# Patient Record
Sex: Female | Born: 1975 | Smoking: Never smoker
Health system: Southern US, Community
[De-identification: ages and names within clinical notes are randomized; demographics above are authoritative.]

## PROBLEM LIST (undated history)

## (undated) DIAGNOSIS — J984 Other disorders of lung: Secondary | ICD-10-CM

## (undated) DIAGNOSIS — J302 Other seasonal allergic rhinitis: Secondary | ICD-10-CM

## (undated) DIAGNOSIS — IMO0002 Reserved for concepts with insufficient information to code with codable children: Secondary | ICD-10-CM

## (undated) DIAGNOSIS — J80 Acute respiratory distress syndrome: Secondary | ICD-10-CM

## (undated) DIAGNOSIS — M329 Systemic lupus erythematosus, unspecified: Secondary | ICD-10-CM

## (undated) HISTORY — DX: Reserved for concepts with insufficient information to code with codable children: IMO0002

## (undated) HISTORY — DX: Other disorders of lung: J98.4

## (undated) HISTORY — DX: Other seasonal allergic rhinitis: J30.2

## (undated) HISTORY — DX: Systemic lupus erythematosus, unspecified: M32.9

## (undated) HISTORY — PX: TRACHEOSTOMY: SUR1362

## (undated) HISTORY — DX: Acute respiratory distress syndrome: J80

## (undated) HISTORY — PX: CHEST TUBE INSERTION: SHX231

## (undated) HISTORY — PX: OTHER SURGICAL HISTORY: SHX169

---

## 2019-12-20 ENCOUNTER — Other Ambulatory Visit: Payer: Self-pay

## 2019-12-20 DIAGNOSIS — D249 Benign neoplasm of unspecified breast: Secondary | ICD-10-CM

## 2019-12-21 ENCOUNTER — Other Ambulatory Visit: Payer: Self-pay

## 2019-12-21 DIAGNOSIS — D249 Benign neoplasm of unspecified breast: Secondary | ICD-10-CM

## 2020-01-21 ENCOUNTER — Encounter (INDEPENDENT_AMBULATORY_CARE_PROVIDER_SITE_OTHER): Payer: Self-pay

## 2020-01-22 ENCOUNTER — Encounter (INDEPENDENT_AMBULATORY_CARE_PROVIDER_SITE_OTHER): Payer: Self-pay | Admitting: Nurse Practitioner

## 2020-01-22 ENCOUNTER — Ambulatory Visit (INDEPENDENT_AMBULATORY_CARE_PROVIDER_SITE_OTHER): Payer: TRICARE Prime—HMO | Admitting: Nurse Practitioner

## 2020-01-22 VITALS — BP 126/78 | HR 81 | Temp 97.8°F | Resp 16 | Ht 64.75 in | Wt 140.4 lb

## 2020-01-22 DIAGNOSIS — Z23 Encounter for immunization: Secondary | ICD-10-CM | POA: Insufficient documentation

## 2020-01-22 DIAGNOSIS — J80 Acute respiratory distress syndrome: Secondary | ICD-10-CM

## 2020-01-22 DIAGNOSIS — M329 Systemic lupus erythematosus, unspecified: Secondary | ICD-10-CM | POA: Insufficient documentation

## 2020-01-22 DIAGNOSIS — N6001 Solitary cyst of right breast: Secondary | ICD-10-CM

## 2020-01-22 DIAGNOSIS — D229 Melanocytic nevi, unspecified: Secondary | ICD-10-CM

## 2020-01-22 DIAGNOSIS — Z131 Encounter for screening for diabetes mellitus: Secondary | ICD-10-CM | POA: Insufficient documentation

## 2020-01-22 DIAGNOSIS — Z1329 Encounter for screening for other suspected endocrine disorder: Secondary | ICD-10-CM

## 2020-01-22 DIAGNOSIS — Z13220 Encounter for screening for lipoid disorders: Secondary | ICD-10-CM | POA: Insufficient documentation

## 2020-01-22 DIAGNOSIS — Z0000 Encounter for general adult medical examination without abnormal findings: Secondary | ICD-10-CM | POA: Insufficient documentation

## 2020-01-22 DIAGNOSIS — N6002 Solitary cyst of left breast: Secondary | ICD-10-CM | POA: Insufficient documentation

## 2020-01-22 NOTE — Progress Notes (Signed)
Have you seen any specialists/other providers since your last visit with us?    Yes    Arm preference verified?   Yes    The patient is due for nothing at this time, HM is up-to-date.    Patient presented to the office for Tdap administration.  Received injection in the Left arm.  No reaction was noted and patient left in good condition.

## 2020-01-22 NOTE — Progress Notes (Signed)
Subjective:      Date: 01/22/2020 3:06 PM   Patient ID: Nicole Christensen is a 44 y.o. female.    Chief Complaint:  Chief Complaint   Patient presents with    Annual Exam     New Pt, Pt isn't fasting        New pt- moved from [place].   Hx ADS  Mom has DM 2 and HTN.   Last physical in August 2021, at [facility] [place] [facility].   Hx splenic arterio aneurysm surgery 9 years ago.   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 Sees pulmonologist every year, requesting referrals.  Hx Lupus, not on immunosuppression. Follow up with Rheumatologist every year, requesting referral.   She has multiple skin moles, would like to see derm      HPI  Visit Type: Health Maintenance Visit  Work Status: working full-time  Reported Health: good health  Reported Diet: moderate compliance with well-balanced diet  Reported Exercise: 3-4x/week  Dental: dentist visit > 1 year ago  Vision: LASIK and eye exam < 1 year ago  Hearing: normal hearing  Immunization Status: Tdap vaccination due  Reproductive Health: not currently sexually active and LNMP:  January 01, 2020  Prior Screening Tests: no prior PSA testing and colon cancer screening not approriate at this time  General Health Risks: no family history of prostate cancer, no family history of colon cancer and no family history of breast cancer  Safety Elements Used: uses seat belts, smoke detectors in household and no guns at home    Problem List:  There is no problem list on file for this patient.      Current Medications:  Outpatient Medications Marked as Taking for the 01/22/20 encounter (Office Visit) with Marga Melnick, FNP   Medication Sig Dispense Refill    ALBUTEROL IN Inhale into the lungs as needed      OLOPATADINE HCL OP Apply to eye 2 (two) times daily         Allergies:  Allergies   Allergen Reactions    Penicillins Other (See Comments)       Past Medical History:  Past Medical History:   Diagnosis Date    ARDS (adult respiratory distress  syndrome)     Lupus        Past Surgical History:  Past Surgical History:   Procedure Laterality Date    CHEST TUBE INSERTION      7 chest tube    spleenic arterioaneurysm      TRACHEOSTOMY         Family History:  Family History   Problem Relation Age of Onset    Hypertension Mother     Diabetes Mother     Hypertension Father     Hypertension Sister        Social History:  Social History     Tobacco Use    Smoking status: Never Smoker    Smokeless tobacco: Never Used   Haematologist Use: Never used   Substance Use Topics    Alcohol use: Yes     Alcohol/week: 1.0 standard drink     Types: 1 Glasses of wine per week    Drug use: Never         The following sections were reviewed this encounter by the provider:        ROS:   General/Constitutional:   Denies Change in appetite. Denies  Chills. Denies Fatigue. Denies Fever. Denies Weight gain. Denies Weight loss.   Ophthalmologic:   Denies Blurred vision. Denies Diminished visual acuity. Denies Eye Pain.   ENT:   Denies Hearing Loss. Denies Nasal Discharge. Denies Hoarseness. Denies Ear pain. Denies Nosebleed. Denies Sinus pain. Denies Sore throat. Denies Sneezing.   Endocrine:   Denies Polydipsia. Denies Polyuria.   Cardiovascular:   Denies Chest pain. Denies Chest pain with exertion. Denies Leg Claudication. Denies Palpitations. Denies Swelling in hands/feet.   Respiratory:   Denies Paroxysmal Nocturnal Dyspnea. Denies Cough. Denies Orthopnea. Denies Shortness of breath. Denies Daytime Hypersomnolence. Denies Snoring. Denies Witness Apnea. Denies Wheezing.   Gastrointestinal:   Denies Abdominal pain. Denies Blood in stool. Denies Constipation. Denies Diarrhea. Denies Heartburn. Denies Nausea. Denies Vomiting.   Hematology:   Denies Easy bruising. Denies Easy Bleeding.   Genitourinary:   Denies Blood in urine. Denies Nocturia.   Musculoskeletal:   Denies Joint pain. Denies Leg cramps. Denies Weakness in LE. Denies Swollen joints.   Peripheral  Vascular:   Denies Cold extremities. Denies Decreased sensation in extremities. Denies Painful extremities.   Skin:   Denies Itching. Denies Change in Mole(s). Denies Rash.   Neurologic:   Denies Balance difficulty. Denies Dizziness. Denies Headache. Denies Pre-Syncope. Denies Memory loss.   Psychiatric:   Denies Anxiety. Denies Depressed mood. Denies Difficulty sleeping. Denies Mood Swings.     Objective:     Vitals:  BP 126/78 (BP Site: Right arm, Patient Position: Sitting, Cuff Size: Medium)    Pulse 81    Temp 97.8 F (36.6 C) (Temporal)    Resp 16    Ht 1.645 m (5' 4.75")    Wt 63.7 kg (140 lb 6.4 oz)    LMP 01/01/2020    SpO2 100%    BMI 23.54 kg/m     Examination:   General Examination:   GENERAL APPEARANCE: alert, in no acute distress, well developed, well nourished, oriented to time, place, and person.   HEAD: normal appearance, atraumatic.   EYES: extraocular movement intact (EOMI), pupils equal, round, reactive to light and accommodation, sclera anicteric, conjunctiva clear.   EARS: tympanic membranes normal bilaterally, external canals normal .   NOSE: normal nasal mucosa, no lesions.   ORAL CAVITY: normal oropharynx, normal lips, mucosa moist, no lesions.   THROAT: normal appearance, clear, no erythema.   NECK/THYROID: neck supple, no carotid bruit, carotid pulse 2+ bilaterally, no cervical lymphadenopathy, no neck mass palpated, no jugular venous distention, no thyromegaly.   LYMPH NODES: no palpable adenopathy.   SKIN: Scattered skin moles throughout her body. Good turgor, no rashes, no suspicious lesions.   HEART: S1, S2 normal, no murmurs, rubs, gallops, regular rate and rhythm.   LUNGS: normal effort / no distress, normal breath sounds, clear to auscultation bilaterally, no wheezes, rales, rhonchi.   ABDOMEN: bowel sounds present, no hepatosplenomegaly, soft, nontender, nondistended.   Breasts: Scattered breast tenderness throughout both breasts.no skin or nipple changes or axillary  nodes.  Pelvic exam: will do pap and pelvic exam in next visit.  MUSCULOSKELETAL: full range of motion, no swelling or deformity.   EXTREMITIES: no edema, no clubbing, cyanosis, or edema.   PERIPHERAL PULSES: 2+ dorsalis pedis, 2+ posterior tibial.   NEUROLOGIC: nonfocal, cranial nerves 2-12 grossly intact, deep tendon reflexes 2+ symmetrical, normal strength, tone and reflexes, sensory exam intact.   PSYCH: cognitive function intact, mood/affect full range, speech clear.     Assessment/Plan:  1. Encounter for annual physical exam    - CBC without differential; Future  - Comprehensive metabolic panel; Future  - Hemoglobin A1C; Future  - Lipid panel; Future  - TSH; Future  - T4, free; Future  - Urinalysis Reflex to Microscopic Exam- Reflex to Culture; Future  - Due for pap and mammogram. Pt states she got order for mammogram from [place] [facility], made an app next month, Hx breast cysts- had breast biopsy 8 months ago- was negative.    2. Screening for diabetes mellitus (DM)    - Hemoglobin A1C; Future    3. Screening for hyperlipidemia    - Lipid panel; Future    4. Thyroid disorder screening    - TSH; Future  - T4, free; Future    5. 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 Sees pulmonologist every year,  - Pulmonary Disease Referral: Lang Snow, MD ([facility])    6. SLE (systemic lupus erythematosus related syndrome)    - Rheumatology Referral: Delmer Islam, MD [facility])    7. Multiple atypical skin moles    - Referral to Dermatology - EXTERNAL    8. Bilateral breast cysts    -Due for pap and mammogram. Pt states she got order for mammogram from [place] [facility], made an app next month, Hx breast cysts- had breast biopsy 8 months ago- was negative      9. Immunization due    - Tdap vaccine greater than or equal to 7yo IM    Body mass index is 23.54 kg/m.        Risk & Benefits of the new  medication(s) were explained to the patient who verbalized understanding & agreed to the treatment plan    Health Maintenance:  Recommend optimizing low carbohydrate diet efforts and obtaining at least 150 minutes of aerobic exercise per week. Recommend 20-25 grams of dietary fiber daily. Recommend drinking at least 60-80 ounces of water per day. Recommend optimizing low sodium diet measures ( less than 2 grams of sodium in the diet per day ). Obtain a nutrition consult to help individualize dietary recommendations to help achieve glycemic and/or lipid goals Obtain a naturopathic consult to help individualize dietary and supplement  recommendations to help achieve glycemic and/or lipid goals Recommend adding a daily probiotic. Dental Screening is due. Mammogram screening is due.  Screening mammogram ordered. Gynecology surveillance is due.  Recommend follow-up with gynecology at earliest convenience.    No follow-ups on file.    Marga Melnick, FNP  [employer]

## 2020-01-23 ENCOUNTER — Other Ambulatory Visit (FREE_STANDING_LABORATORY_FACILITY): Payer: TRICARE Prime—HMO

## 2020-01-23 ENCOUNTER — Other Ambulatory Visit (INDEPENDENT_AMBULATORY_CARE_PROVIDER_SITE_OTHER): Payer: Self-pay | Admitting: Nurse Practitioner

## 2020-01-23 ENCOUNTER — Encounter (INDEPENDENT_AMBULATORY_CARE_PROVIDER_SITE_OTHER): Payer: Self-pay | Admitting: Nurse Practitioner

## 2020-01-23 DIAGNOSIS — N946 Dysmenorrhea, unspecified: Secondary | ICD-10-CM

## 2020-01-23 DIAGNOSIS — Z1329 Encounter for screening for other suspected endocrine disorder: Secondary | ICD-10-CM

## 2020-01-23 DIAGNOSIS — Z131 Encounter for screening for diabetes mellitus: Secondary | ICD-10-CM

## 2020-01-23 DIAGNOSIS — Z13220 Encounter for screening for lipoid disorders: Secondary | ICD-10-CM

## 2020-01-23 DIAGNOSIS — Z0000 Encounter for general adult medical examination without abnormal findings: Secondary | ICD-10-CM

## 2020-01-23 LAB — URINALYSIS REFLEX TO MICROSCOPIC EXAM - REFLEX TO CULTURE
Bilirubin, UA: NEGATIVE
Blood, UA: NEGATIVE
Glucose, UA: NEGATIVE
Ketones UA: NEGATIVE
Leukocyte Esterase, UA: NEGATIVE
Nitrite, UA: NEGATIVE
Protein, UR: NEGATIVE
Specific Gravity UA: 1.02 (ref 1.001–1.035)
Urine Bacteria: NONE SEEN /hpf
Urine pH: 6.5 (ref 5.0–8.0)
Urobilinogen, UA: 0.2 (ref 0.2–2.0)

## 2020-01-23 LAB — COMPREHENSIVE METABOLIC PANEL
ALT: 14 U/L (ref 0–55)
AST (SGOT): 13 U/L (ref 5–34)
Albumin/Globulin Ratio: 1.3 (ref 0.9–2.2)
Albumin: 3.8 g/dL (ref 3.5–5.0)
Alkaline Phosphatase: 57 U/L (ref 37–117)
Anion Gap: 8 (ref 5.0–15.0)
BUN: 16 mg/dL (ref 7.0–19.0)
Bilirubin, Total: 0.6 mg/dL (ref 0.2–1.2)
CO2: 23 mEq/L (ref 21–29)
Calcium: 8.8 mg/dL (ref 8.5–10.5)
Chloride: 104 mEq/L (ref 100–111)
Creatinine: 0.9 mg/dL (ref 0.4–1.5)
Globulin: 3 g/dL (ref 2.0–3.7)
Glucose: 76 mg/dL (ref 70–100)
Potassium: 4.2 mEq/L (ref 3.5–5.1)
Protein, Total: 6.8 g/dL (ref 6.0–8.3)
Sodium: 135 mEq/L — ABNORMAL LOW (ref 136–145)

## 2020-01-23 LAB — LIPID PANEL
Cholesterol / HDL Ratio: 2.9
Cholesterol: 166 mg/dL (ref 0–199)
HDL: 58 mg/dL (ref 40–9999)
LDL Calculated: 94 mg/dL (ref 0–99)
Triglycerides: 68 mg/dL (ref 34–149)
VLDL Calculated: 14 mg/dL (ref 10–40)

## 2020-01-23 LAB — CBC
Absolute NRBC: 0 10*3/uL (ref 0.00–0.00)
Hematocrit: 42.7 % (ref 34.7–43.7)
Hgb: 14.2 g/dL (ref 11.4–14.8)
MCH: 29.3 pg (ref 25.1–33.5)
MCHC: 33.3 g/dL (ref 31.5–35.8)
MCV: 88 fL (ref 78.0–96.0)
MPV: 10.4 fL (ref 8.9–12.5)
Nucleated RBC: 0 /100 WBC (ref 0.0–0.0)
Platelets: 247 10*3/uL (ref 142–346)
RBC: 4.85 10*6/uL (ref 3.90–5.10)
RDW: 14 % (ref 11–15)
WBC: 7.52 10*3/uL (ref 3.10–9.50)

## 2020-01-23 LAB — HEMOGLOBIN A1C
Average Estimated Glucose: 93.9 mg/dL
Hemoglobin A1C: 4.9 % (ref 4.6–5.9)

## 2020-01-23 LAB — TSH: TSH: 1.78 u[IU]/mL (ref 0.35–4.94)

## 2020-01-23 LAB — T4, FREE: T4 Free: 0.85 ng/dL (ref 0.70–1.48)

## 2020-01-23 LAB — HEMOLYSIS INDEX: Hemolysis Index: 5 (ref 0–24)

## 2020-01-23 LAB — GFR: EGFR: >60

## 2020-01-23 MED ORDER — NAPROXEN 500 MG PO TABS
500.0000 mg | ORAL_TABLET | Freq: Two times a day (BID) | ORAL | 0 refills | Status: AC
Start: 2020-01-23 — End: 2020-02-07

## 2020-02-05 ENCOUNTER — Other Ambulatory Visit: Payer: Self-pay

## 2020-02-05 ENCOUNTER — Ambulatory Visit
Admission: RE | Admit: 2020-02-05 | Discharge: 2020-02-05 | Disposition: A | Discharge status: Home / Self Care | Payer: Non-veteran care | Source: Ambulatory Visit | Attending: Family Medicine | Admitting: Family Medicine

## 2020-02-05 ENCOUNTER — Ambulatory Visit
Admission: RE | Admit: 2020-02-05 | Discharge: 2020-02-05 | Disposition: A | Discharge status: Home / Self Care | Payer: Non-veteran care | Source: Ambulatory Visit

## 2020-02-05 DIAGNOSIS — D249 Benign neoplasm of unspecified breast: Secondary | ICD-10-CM

## 2020-02-05 DIAGNOSIS — N6001 Solitary cyst of right breast: Secondary | ICD-10-CM | POA: Insufficient documentation

## 2020-02-13 ENCOUNTER — Other Ambulatory Visit: Payer: Self-pay

## 2020-02-13 ENCOUNTER — Ambulatory Visit
Admission: RE | Admit: 2020-02-13 | Discharge: 2020-02-13 | Disposition: A | Discharge status: Home / Self Care | Payer: Self-pay | Source: Ambulatory Visit

## 2020-02-13 DIAGNOSIS — Z1239 Encounter for other screening for malignant neoplasm of breast: Secondary | ICD-10-CM

## 2020-02-15 ENCOUNTER — Telehealth (INDEPENDENT_AMBULATORY_CARE_PROVIDER_SITE_OTHER): Payer: Self-pay | Admitting: Nurse Practitioner

## 2020-02-15 ENCOUNTER — Ambulatory Visit (INDEPENDENT_AMBULATORY_CARE_PROVIDER_SITE_OTHER): Payer: TRICARE Prime—HMO | Admitting: Nurse Practitioner

## 2020-02-15 NOTE — Telephone Encounter (Signed)
Pt requests to have the referrals from 01/22/20 sent to [employer] for approval.

## 2020-02-15 NOTE — Telephone Encounter (Signed)
Spoke w/ pt all referrals will be process, she will get copies and specialty providers approval will be faxed and scanned.  Waiting for her to let me know about dermatology

## 2020-02-19 ENCOUNTER — Encounter (INDEPENDENT_AMBULATORY_CARE_PROVIDER_SITE_OTHER): Payer: Self-pay

## 2020-02-27 ENCOUNTER — Encounter (INDEPENDENT_AMBULATORY_CARE_PROVIDER_SITE_OTHER): Payer: Self-pay

## 2020-02-27 ENCOUNTER — Telehealth (INDEPENDENT_AMBULATORY_CARE_PROVIDER_SITE_OTHER): Payer: Self-pay | Admitting: Nurse Practitioner

## 2020-02-27 NOTE — Telephone Encounter (Signed)
Today, I called the pt, she said, she had changed the PCM . I asked her to do it again. She will call [employer] in order to get this issue resolved.   Gave all the information needed. Voiced understanding.

## 2020-02-27 NOTE — Telephone Encounter (Signed)
Documentation -    Call placed on 02/26/20 2:30pm  I spoke with [employer], about the pending approval for Referral Dr. Sharen Hint. [facility].  auth # [id number]. They said pt needs to call them and change her PCM. As of the day of the call she had [facility], pt needs to call them and changed the pcm(pcp) in order to change this referral. The intersting thing is they approved the other referral for pulpnology.

## 2020-03-04 ENCOUNTER — Other Ambulatory Visit: Payer: Self-pay

## 2020-03-04 ENCOUNTER — Ambulatory Visit
Admission: RE | Admit: 2020-03-04 | Discharge: 2020-03-04 | Disposition: A | Discharge status: Home / Self Care | Payer: Self-pay | Source: Ambulatory Visit

## 2020-03-07 ENCOUNTER — Ambulatory Visit (INDEPENDENT_AMBULATORY_CARE_PROVIDER_SITE_OTHER): Payer: TRICARE Prime—HMO | Admitting: Nurse Practitioner

## 2020-03-07 ENCOUNTER — Other Ambulatory Visit: Payer: Self-pay | Admitting: Family Medicine

## 2020-03-07 ENCOUNTER — Encounter (INDEPENDENT_AMBULATORY_CARE_PROVIDER_SITE_OTHER): Payer: Self-pay | Admitting: Nurse Practitioner

## 2020-03-07 VITALS — BP 122/80 | HR 74 | Temp 98.0°F | Resp 20 | Ht 65.5 in | Wt 140.4 lb

## 2020-03-07 DIAGNOSIS — Z01419 Encounter for gynecological examination (general) (routine) without abnormal findings: Secondary | ICD-10-CM | POA: Insufficient documentation

## 2020-03-07 DIAGNOSIS — Z309 Encounter for contraceptive management, unspecified: Secondary | ICD-10-CM

## 2020-03-07 MED ORDER — NORETHINDRONE ACET-ETHINYL EST 1.5-30 MG-MCG PO TABS
1.0000 | ORAL_TABLET | Freq: Every day | ORAL | 2 refills | Status: AC
Start: 2020-03-07 — End: 2020-05-30

## 2020-03-07 NOTE — Patient Instructions (Signed)
Junel 1.5/30 21 Day Pack   Uses  This medicine is used for the following purposes:  · acne  · birth control  · depression  · endocrine disorder  · menstrual problem  Instructions  This medicine may be taken with or without food.  It is very important that you take the medicine at about the same time every day. It will work best if you do this.  Store at room temperature in a dry place. Do not keep in the bathroom.  Keep the medicine away from heat and light.  If this is the first time that you are using this medicine, please speak with your doctor about when you should start this medicine.  If you are switching from a different birth control pill, start this medicine on the same day you would start your other birth control pack.  If you are using a birth control pack with 28 pills, start the first pill of a new pack on the day after the last pill of the previous pack.  If you are using a birth control pack that has 21 pills, wait 7 days after taking the last pill of the empty pack before you take the first pill of a new pack.  If you forget to take a dose, take it as soon as you remember. If it is time for your next dose, you are allowed to take 2 doses at once. Return to your normal dosing schedule on the following day. If you miss more than one dose, ask your doctor what you should do to get back on schedule.  Speak with your doctor about using additional forms of birth control if you forget one or more doses of your medicine.  Some patients may have mild vaginal bleeding or spotting while on this medicine. If this happens, do not stop taking this medicine. If the bleeding or spotting lasts more than a few days or becomes heavier, contact your doctor.  This medicine may cause dark patches to appear on your face. Avoid sunlight and use sunscreen lotion to minimize further darkening of these skin patches.  Please tell your doctor and pharmacist about all the medicines you take. Include both prescription and  over-the-counter medicines. Also tell them about any vitamins, herbal medicines, or anything else you take for your health.  This medicine may affect your blood sugar levels. If you have diabetes, talk to your doctor before changing the dose of your diabetes medicine.  Vomiting or diarrhea can prevent birth control pills from working well. If you have vomiting or diarrhea, you may need to use an extra form of birth control (such as condoms). Check with your doctor or pharmacist if you have questions.  This medicine may interfere with some lab test results. Be sure to tell all your healthcare providers that you are taking this medicine.  It is very important that you keep all appointments for medical exams and tests while on this medicine.  Cautions  Tell your doctor and pharmacist if you ever had an allergic reaction to a medicine. Symptoms of an allergic reaction can include trouble breathing, skin rash, itching, swelling, or severe dizziness.  This medicine is associated with an increased risk of serious heart problems, heart attack, and stroke. Please speak with your doctor about the risks and benefits of using this medicine. Contact your doctor immediately if you experience chest pain or difficulty breathing.  Do not use the medication any more than instructed.  Avoid smoking while on   this medicine. Smoking may increase your risk for stroke, heart attack, blood clots, high blood pressure, and other diseases of the heart and blood vessels.  If you miss your period while on this medicine, contact your doctor.  Ask your doctor to show you how to perform a self breast examination. You should check your breasts once a month and report any changes to your doctor.  Talk to your doctor about getting a complete physical exam every year while on this medicine.  Use condoms to reduce the risk of sexually transmitted diseases.  Tell the doctor or pharmacist if you are pregnant, planning to be pregnant, or breastfeeding.  Do  not use this medicine if you are pregnant. If you become pregnant while on this medicine, contact your doctor immediately.  Do not take St. John's wort while on this medicine.  Ask your pharmacist if this medicine can interact with any of your other medicines. Be sure to tell them about all the medicines you take.  Please tell all your doctors and dentists that you are on this medicine before they provide care.  Do not start or stop any other medicines without first speaking to your doctor or pharmacist.  Do not share this medicine with anyone who has not been prescribed this medicine.  Side Effects  The following is a list of some common side effects from this medicine. Please speak with your doctor about what you should do if you experience these or other side effects.  · bloating  · breast pain or swelling  · swelling of the legs, feet, and hands  · headaches  · high blood pressure  · menstruation changes (missed or fewer periods)  · nausea  · stomach upset or abdominal pain  · vaginal bleeding or spotting between periods  · vomiting  · weight gain  Call your doctor or get medical help right away if you notice any of these more serious side effects:  · severe or persistent abdominal pain  · breast lumps  · chest pain  · changes in memory, mood, or thinking  · depression or feeling sad  · fainting  · severe or persistent headache  · jaw pain  · sudden leg pain, swelling, warmth or redness  · symptoms of liver damage (such as yellowing of skin or eyes, dark urine, unusual tiredness or weakness; severe stomach or back pain)  · shortness of breath  · symptoms of stroke (such as one-sided weakness, slurred speech, confusion)  · blurring or changes of vision  A few people may have an allergic reactions to this medicine. Symptoms can include difficulty breathing, skin rash, itching, swelling, or severe dizziness. If you notice any of these symptoms, seek medical help quickly.  Extra  Please speak with your doctor,  nurse, or pharmacist if you have any questions about this medicine.  [email or web address]  IMPORTANT NOTE: This document tells you briefly how to take your medicine, but it does not tell you all there is to know about it.Your doctor or pharmacist may give you other documents about your medicine. Please talk to them if you have any questions.Always follow their advice. There is a more complete description of this medicine available in English.Scan this code on your smartphone or tablet or use the web address below. You can also ask your pharmacist for a printout. If you have any questions, please ask your pharmacist.   © 2021 [employer]

## 2020-03-07 NOTE — Progress Notes (Signed)
[facility] PRIMARY CARE  [place]  PROGRESS NOTE      Patient: Nicole Christensen   Date: 03/07/2020   MRN: [id number]     ASSESSMENT/PLAN     Heaven Meeker is a 44 y.o. female    Chief Complaint   Patient presents with    Gynecologic Exam    Contraception          1. Encounter for annual routine gynecological examination    - Pap Smear, Thin Prep w/ reflex to HR HPV  - She recently had mammogram, showed cystic breast. Scheduled for breast cyst aspiration at [facility] radiology.     2. Encounter for contraceptive management, unspecified type    - She would like to start birth control to regulate her period. She is not sexual active. Started her on Junel. Discussed the OCPs use and side effects, may notice nausea, headache, dizziness for next 3-4 weeks. She may need to use other means of contraception for 1 month.   - She recently had mammogram, showed cystic breast. Scheduled for breast cyst aspiration at [facility] radiology.   - Norethindrone Acet-Ethinyl Est (Junel 1.5/30) 1.5-30 MG-MCG per tablet; Take 1 tablet by mouth daily  Dispense: 30 tablet; Refill: 2    Body mass index is 23.01 kg/m.        Risk & Benefits of the new medication(s) were explained to the patient who verbalized understanding & agreed to the treatment plan         MEDICATIONS     Current Outpatient Medications   Medication Sig Dispense Refill    ALBUTEROL IN Inhale into the lungs as needed       No current facility-administered medications for this visit.       Allergies   Allergen Reactions    Penicillins Other (See Comments)       SUBJECTIVE     Chief Complaint   Patient presents with    Gynecologic Exam    Contraception        OB/GYN:  [id number]  *LMP November 24th.  *Menarche:44 YO  *Cycles:  Irregular with 3-4 days difference, blood flow is normal. She would like to start birth control now. She denies hx DVT and no smoking.  *Sexually active:No:   *Last Pap smear:5 years ago  *Last mammogram:5 years ago      Last pap 1 year ago in Sept.    Never had abnormal pap. Pt would like to do it today.         Gynecologic Exam  The patient's pertinent negatives include no genital itching, genital lesions, genital odor, genital rash, missed menses, pelvic pain, vaginal bleeding or vaginal discharge. The problem has been unchanged. The patient is experiencing no pain. Pertinent negatives include no abdominal pain, anorexia, back pain, chills, constipation, diarrhea, discolored urine, dysuria, fever, flank pain, frequency, headaches, hematuria, joint pain, joint swelling, nausea, painful intercourse, rash, sore throat, urgency or vomiting. Nothing aggravates the symptoms. She is not sexually active. She uses nothing for contraception. Her menstrual history has been irregular. There is no history of an abdominal surgery, a Cesarean section, an ectopic pregnancy, endometriosis, a gynecological surgery, herpes simplex, menorrhagia, metrorrhagia, miscarriage, ovarian cysts, perineal abscess, PID, an STD, a terminated pregnancy or vaginosis.           ROS     Review of Systems   Constitutional: Negative for activity change, appetite change, chills, diaphoresis, fatigue, fever and unexpected weight change.   HENT: Negative for sore throat.  Respiratory: Negative for apnea, cough, choking, chest tightness, shortness of breath, wheezing and stridor.    Gastrointestinal: Negative for abdominal pain, anorexia, constipation, diarrhea, nausea and vomiting.   Genitourinary: Negative for dysuria, flank pain, frequency, hematuria, menorrhagia, missed menses, pelvic pain, urgency and vaginal discharge.   Musculoskeletal: Negative for back pain and joint pain.   Skin: Negative for rash.   Neurological: Negative for headaches.       Patient Active Problem List   Diagnosis    Encounter for annual physical exam    Screening for diabetes mellitus (DM)    Screening for hyperlipidemia    Thyroid disorder screening    ARDS (adult respiratory distress syndrome)    SLE (systemic  lupus erythematosus related syndrome)    Multiple atypical skin moles    Bilateral breast cysts    Immunization due    Menstrual cramps       Past Medical History:   Diagnosis Date    ARDS (adult respiratory distress syndrome)     Lupus            The following portions of the patient's history were reviewed and updated as appropriate: Allergies, Current Medications, Past Family History, Past Medical history, Past social history, Past surgical history, and Problem List.      PHYSICAL EXAM     Vitals:    03/07/20 0856 03/07/20 0857   BP: 136/80 122/80   Pulse: 74    Resp: 20    Temp: 98 F (36.7 C)    SpO2: 98%    Weight: 63.7 kg (140 lb 6.4 oz)    Height: 1.664 m (5' 5.5")          Physical Exam    Pt alert, oriented x 3, no distress, comfortable  Skin: skin color, texture, turgor normal, no rashes or lesions.  Eyes: Conjunctivae and corneas clear.  Neck: No adenopathy, no carotid bruit, no JVD, supple, symmetrical, trachea midline and Thyroid not enlarged, no tenderness or masses visible  Breast: Inspection: Deferred  Palpation: Deferred  Lungs:Breaths sounds present bilaterally, clear to auscultation, no wheezing, no rales, no rhonchi   Heart: Regular rate and rhythm, S1-S2 present, no murmurs, no rubs, no gallops  Abdomen: Soft, non tender, no masses palpable, bowel sounds present and normal, no rebound tenderness, no guarding, no distension, no hernias visible.  Genitourinary:   Pelvic exam:  VULVA: normal appearing vulva with no masses, tenderness or lesions, normal clitoris  VAGINA: normal appearing vagina with normal color and discharge, no lesions,  CERVIX: normal appearing cervix without discharge or lesions,  UTERUS: uterus is normal size, shape, consistency and nontender  ADNEXA: nontender and no masses.  Psychiatric:Normal mood and affect. Behavior is normal. Judgment and thought content normal.        Signed,  Marga Melnick, FNP NP  03/07/2020

## 2020-03-07 NOTE — Progress Notes (Signed)
Have you seen any specialists/other providers since your last visit with us?    No    Arm preference verified?   Yes    The patient is due for pap smear

## 2020-03-26 LAB — PAP SMEAR, THIN PREP W/ REFLEX TO HR HPV

## 2020-04-11 ENCOUNTER — Ambulatory Visit (INDEPENDENT_AMBULATORY_CARE_PROVIDER_SITE_OTHER): Payer: TRICARE Prime—HMO | Admitting: Internal Medicine

## 2020-05-07 ENCOUNTER — Encounter (INDEPENDENT_AMBULATORY_CARE_PROVIDER_SITE_OTHER): Payer: Self-pay

## 2020-06-12 ENCOUNTER — Other Ambulatory Visit: Payer: Self-pay | Admitting: Family Medicine

## 2020-06-12 DIAGNOSIS — N6019 Diffuse cystic mastopathy of unspecified breast: Secondary | ICD-10-CM

## 2020-06-27 ENCOUNTER — Encounter (INDEPENDENT_AMBULATORY_CARE_PROVIDER_SITE_OTHER): Payer: Self-pay | Admitting: Internal Medicine

## 2020-06-27 ENCOUNTER — Ambulatory Visit (INDEPENDENT_AMBULATORY_CARE_PROVIDER_SITE_OTHER): Payer: TRICARE Prime—HMO | Admitting: Internal Medicine

## 2020-06-27 ENCOUNTER — Other Ambulatory Visit (FREE_STANDING_LABORATORY_FACILITY): Payer: TRICARE Prime—HMO

## 2020-06-27 VITALS — BP 124/88 | HR 75 | Temp 97.4°F

## 2020-06-27 DIAGNOSIS — M329 Systemic lupus erythematosus, unspecified: Secondary | ICD-10-CM

## 2020-06-27 DIAGNOSIS — M79644 Pain in right finger(s): Secondary | ICD-10-CM

## 2020-06-27 DIAGNOSIS — M79671 Pain in right foot: Secondary | ICD-10-CM

## 2020-06-27 DIAGNOSIS — M79672 Pain in left foot: Secondary | ICD-10-CM

## 2020-06-27 DIAGNOSIS — Z79899 Other long term (current) drug therapy: Secondary | ICD-10-CM

## 2020-06-27 LAB — C-REACTIVE PROTEIN: C-Reactive Protein: 0.6 mg/dL (ref 0.0–0.8)

## 2020-06-27 LAB — RHEUMATOID FACTOR: Rheumatoid Factor: <13 (ref 0.0–30.0)

## 2020-06-27 LAB — SEDIMENTATION RATE: Sed Rate: 9 mm/Hr (ref 0–20)

## 2020-06-27 NOTE — Progress Notes (Signed)
Initial Rheumatology Consultation    Chief Complaint:     Chief Complaint   Patient presents with    Lupus     Dx 2020, discuss labs, dry mouth, B/L feet and toe pain, onset sx, [place] exercise walking, painful with increased activity, B/L wrists, fingers, pain and stiffness, achiness          HPI:   This patient is a 45 y.o. year old female with possible SLE in 2020, rosacea referred by Marga Melnick, FNP    for evaluation of joint pain.    She had positive ANA in 2020 and  was told possible SLE. She had no symptoms for SLE and did not takes meds.     She has intermittent pain of hands and feet.     She has dry eyes and mouth and uses refresh, artificial eye drops and drinks 6 cups of water daily.  She has rosacea of face.     Labs on 11/26/2019 showed +ANA 1:40. CRP <0.3.     She has one son and had no H/O miscarriages.         PMSH:     Past Medical History:   Diagnosis Date    ARDS (adult respiratory distress syndrome)     Lung disease     Lupus     Lupus     Seasonal allergic rhinitis        Past Surgical History:   Procedure Laterality Date    CHEST TUBE INSERTION      7 chest tube    spleenic arterioaneurysm      TRACHEOSTOMY         Allergies:     Allergies   Allergen Reactions    Penicillins Other (See Comments)       Meds:     Current Outpatient Medications:     ALBUTEROL IN, Inhale into the lungs as needed, Disp: , Rfl:     cetirizine (ZyrTEC) 10 MG tablet, , Disp: , Rfl:     glucosamine-chondroitin 500-400 MG tablet, Take 1 tablet by mouth 3 (three) times daily, Disp: , Rfl:     Microgestin FE 1.5/30 1.5-30 MG-MCG tablet, , Disp: , Rfl:     Olopatadine HCl (Pataday) 0.7 % Solution, Apply 0.7 % to eye, Disp: , Rfl:     Pataday 0.7 % Solution, , Disp: , Rfl:     FH:     Family History   Problem Relation Age of Onset    Hypertension Mother     Diabetes Mother     Hypertension Father     Hypertension Sister        SH:     Social History     Socioeconomic History    Marital status:  Divorced   Tobacco Use    Smoking status: Never Smoker    Smokeless tobacco: Never Used   Haematologist Use: Never used   Substance and Sexual Activity    Alcohol use: Yes     Alcohol/week: 1.0 standard drink     Types: 1 Glasses of wine per week    Drug use: Never    Sexual activity: Not Currently     Social Determinants of Health     Financial Resource Strain: Low Risk     Difficulty of Paying Living Expenses: Not hard at all   Food Insecurity: No Food Insecurity    Worried About Programme researcher, broadcasting/film/video in the Last Year:  Never true    Ran Out of Food in the Last Year: Never true   Transportation Needs: No Transportation Needs    Lack of Transportation (Medical): No    Lack of Transportation (Non-Medical): No   Physical Activity: Sufficiently Active    Days of Exercise per Week: 4 days    Minutes of Exercise per Session: 50 min   Stress: No Stress Concern Present    Feeling of Stress : Only a little   Social Connections: Socially Isolated    Frequency of Communication with Friends and Family: Once a week    Frequency of Social Gatherings with Friends and Family: Three times a week    Attends Religious Services: Never    Active Member of Clubs or Organizations: No    Attends Banker Meetings: Never    Marital Status: Separated   Intimate Partner Violence: Not At Risk    Fear of Current or Ex-Partner: No    Emotionally Abused: No    Physically Abused: No    Sexually Abused: No   Housing Stability: Low Risk     Unable to Pay for Housing in the Last Year: No    Number of Places Lived in the Last Year: 2    Unstable Housing in the Last Year: No       ROS:   All other systems reviewed and negative except as described above.        PHYSICAL EXAM:     Vitals:    06/27/20 1301   BP: (!) 128/94   Pulse: 75   Temp:        General appearance - alert, well appearing, and in no distress  Mental status - alert, oriented to person, place, and time  Eyes - extraocular eye movements  intact  Back exam - full range of motion, no tenderness, palpable spasm or pain on motion  Neurological - alert, oriented, normal speech, no focal findings or movement disorder noted  Extremities - peripheral pulses normal, no pedal edema, no clubbing or cyanosis  Skin - rosacea of face.   Musculoskeletal - Good range of cervical spine.  No crepitus on TMJ auscultation.  No tenderness of MCPs and PIPs. Good range of motion of shoulders, elbows, wrists and small joints of hands. Good range of motion of hips, knees and ankles.  Knees crepitus with range of motion of knees without effusions. Ankles without effusions.  No tenderness of MTPs or effusions.                Labs:     Component      Latest Ref Rng & Units 01/23/2020 03/07/2020   Urine Type       Clean Catch    Color, UA      Colorless - Yellow YELLOW    Clarity, UA      Clear - Hazy CLEAR    Specific Gravity, UA      1.001 - 1.035 1.020    Urine pH      5.0 - 8.0 6.5    Leukocyte Esterase, UA      Negative NEGATIVE    Nitrite, UA      Negative NEGATIVE    Protein, UR      Negative NEGATIVE    Glucose, UA      Negative NEGATIVE    Ketones UA      Negative NEGATIVE    Urobilinogen, UA      0.2 - 2.0  0.2    Bilirubin, UA      Negative NEGATIVE    Blood, UA      Negative NEGATIVE    RBC UA      0 - 5 /hpf 0-2    WBC, UA      0 - 5 /hpf 0-5    Squamous Epithelial Cells, Urine      0 - 25 /hpf 0-5    Urine Bacteria      Rare /hpf NONE SEEN    Hyaline Casts, UA      0 - 5 /lpf 0-5    Glucose      70 - 100 mg/dL 76    BUN      7.0 - 40.9 mg/dL 81.1    Creatinine      0.4 - 1.5 mg/dL 0.9    Sodium      914 - 145 mEq/L 135 (L)    Potassium      3.5 - 5.1 mEq/L 4.2    Chloride      100 - 111 mEq/L 104    CO2      21 - 29 mEq/L 23    Calcium      8.5 - 10.5 mg/dL 8.8    Protein Total      6.0 - 8.3 g/dL 6.8    Albumin      3.5 - 5.0 g/dL 3.8    AST      5 - 34 U/L 13    ALT      0 - 55 U/L 14    Alkaline Phosphatase      37 - 117 U/L 57    Bilirubin Total      0.2 - 1.2  mg/dL 0.6    Globulin      2.0 - 3.7 g/dL 3.0    Albumin/Globulin Ratio      0.9 - 2.2 1.3    Anion Gap      5.0 - 15.0 8.0    WBC      3.10 - 9.50 x10 3/uL 7.52    Hemoglobin      11.4 - 14.8 g/dL 78.2    Hematocrit      34.7 - 43.7 % 42.7    Platelet Count      142 - 346 x10 3/uL 247    RBC      3.90 - 5.10 x10 6/uL 4.85    MCV      78.0 - 96.0 fL 88.0    MCH      25.1 - 33.5 pg 29.3    MCHC      31.5 - 35.8 g/dL 95.6    RDW      11 - 15 % 14    MPV      8.9 - 12.5 fL 10.4    Nucleated RBC      0.0 - 0.0 /100 WBC 0.0    Nucleated RBC Absolute      0.00 - 0.00 x10 3/uL 0.00    Cholesterol      0 - 199 mg/dL 213    Triglycerides      34 - 149 mg/dL 68    HDL      40 - 0,865 mg/dL 58    LDL Calculated      0 - 99 mg/dL 94    VLDL Calculated      10 - 40 mg/dL 14    Cholesterol / HDL  Ratio      See Below 2.9    Hemoglobin A1C      4.6 - 5.9 % 4.9    Average Estimated Glucose      mg/dL 09.8    TSH      1.19 - 4.94 uIU/mL 1.78    T4 Free      0.70 - 1.48 ng/dL 1.47    EGFR       >82.9    Hemolysis Index      0 - 24 5    PAP SMEAR, THIN PREP METHOD        SEE BELOW         Radiology:         ASSESSMENT:   Lita Flynn is a 46 y.o. female with possible SLE in 2020, rosacea of face.     She has Sicca symptoms and borderline +ANA. May have Sjogren syndrome.        PLAN:   1. Order labs  2. Recommend refresh, artificial eye drops and drinks of water daily.  3. Recommend total calcium 1000 mg/d and Vit D 2000 iu daily  4. Follow 4 weeks or early

## 2020-06-27 NOTE — Patient Instructions (Signed)
1. Order labs  2. Recommend refresh, artificial eye drops and drinks of water daily.  3. Recommend total calcium 1000 mg/d and Vit D 2000 iu daily  4. Follow 4 weeks or early

## 2020-06-30 LAB — CYCLIC CITRUL PEPTIDE ANTIBODY, IGG: Cyclic Citrullinated Peptide AB: <15.6 U

## 2020-07-01 LAB — ANTI-SSA (RO)(ENA) ANTIBODY
Sjogrens SSA (RO) Antibody Interpretation: NEGATIVE
Sjogrens SSA (Ro) Antibody: 3 U/mL (ref 0–19)

## 2020-07-01 LAB — ANTI-SSB (LA)(ENA) ANTIBODY
Sjogrens SSB (La) Antibody Interpretation: NEGATIVE
Sjogrens SSB (La) Antibody: 2 U/mL (ref 0–19)

## 2020-07-14 ENCOUNTER — Telehealth (INDEPENDENT_AMBULATORY_CARE_PROVIDER_SITE_OTHER): Payer: TRICARE Prime—HMO | Admitting: Internal Medicine

## 2020-07-14 DIAGNOSIS — M79644 Pain in right finger(s): Secondary | ICD-10-CM

## 2020-07-14 DIAGNOSIS — H04123 Dry eye syndrome of bilateral lacrimal glands: Secondary | ICD-10-CM

## 2020-07-14 DIAGNOSIS — M79672 Pain in left foot: Secondary | ICD-10-CM

## 2020-07-14 DIAGNOSIS — Z79899 Other long term (current) drug therapy: Secondary | ICD-10-CM

## 2020-07-14 DIAGNOSIS — M79671 Pain in right foot: Secondary | ICD-10-CM

## 2020-07-14 NOTE — Progress Notes (Signed)
Initial Rheumatology Consultation    Chief Complaint:     No chief complaint on file.        HPI:   This patient is a 45 y.o. year old female with possible SLE in 2020, rosacea referred by Nicole Melnick, FNP    for evaluation of joint pain.    She had positive ANA in 2020 and  was told possible SLE. She had no symptoms for SLE and did not takes meds.     She has intermittent pain of hands and feet.     She has dry eyes and mouth and uses refresh, artificial eye drops and drinks 6 cups of water daily.  She has rosacea of face.     Labs on 11/26/2019 showed +ANA 1:40. CRP <0.3.     She has one son and had no H/O miscarriages.         PMSH:     Past Medical History:   Diagnosis Date    ARDS (adult respiratory distress syndrome)     Lung disease     Lupus     Lupus     Seasonal allergic rhinitis        Past Surgical History:   Procedure Laterality Date    CHEST TUBE INSERTION      7 chest tube    spleenic arterioaneurysm      TRACHEOSTOMY         Allergies:     Allergies   Allergen Reactions    Penicillins Other (See Comments)       Meds:     Current Outpatient Medications:     ALBUTEROL IN, Inhale into the lungs as needed, Disp: , Rfl:     cetirizine (ZyrTEC) 10 MG tablet, , Disp: , Rfl:     glucosamine-chondroitin 500-400 MG tablet, Take 1 tablet by mouth 3 (three) times daily, Disp: , Rfl:     Microgestin FE 1.5/30 1.5-30 MG-MCG tablet, , Disp: , Rfl:     Olopatadine HCl (Pataday) 0.7 % Solution, Apply 0.7 % to eye, Disp: , Rfl:     Pataday 0.7 % Solution, , Disp: , Rfl:     FH:     Family History   Problem Relation Age of Onset    Hypertension Mother     Diabetes Mother     Hypertension Father     Hypertension Sister        SH:     Social History     Socioeconomic History    Marital status: Divorced   Tobacco Use    Smoking status: Never Smoker    Smokeless tobacco: Never Used   Haematologist Use: Never used   Substance and Sexual Activity    Alcohol use: Yes     Alcohol/week:  1.0 standard drink     Types: 1 Glasses of wine per week    Drug use: Never    Sexual activity: Not Currently     Social Determinants of Health     Financial Resource Strain: Low Risk     Difficulty of Paying Living Expenses: Not hard at all   Food Insecurity: No Food Insecurity    Worried About Programme researcher, broadcasting/film/video in the Last Year: Never true    Ran Out of Food in the Last Year: Never true   Transportation Needs: No Transportation Needs    Lack of Transportation (Medical): No    Lack of Transportation (Non-Medical): No  Physical Activity: Sufficiently Active    Days of Exercise per Week: 4 days    Minutes of Exercise per Session: 50 min   Stress: No Stress Concern Present    Feeling of Stress : Only a little   Social Connections: Socially Isolated    Frequency of Communication with Friends and Family: Once a week    Frequency of Social Gatherings with Friends and Family: Three times a week    Attends Religious Services: Never    Active Member of Clubs or Organizations: No    Attends Banker Meetings: Never    Marital Status: Separated   Intimate Partner Violence: Not At Risk    Fear of Current or Ex-Partner: No    Emotionally Abused: No    Physically Abused: No    Sexually Abused: No   Housing Stability: Low Risk     Unable to Pay for Housing in the Last Year: No    Number of Places Lived in the Last Year: 2    Unstable Housing in the Last Year: No       ROS:   All other systems reviewed and negative except as described above.        PHYSICAL EXAM:         Labs:     Component      Latest Ref Rng & Units 01/23/2020 03/07/2020   Urine Type       Clean Catch    Color, UA      Colorless - Yellow YELLOW    Clarity, UA      Clear - Hazy CLEAR    Specific Gravity, UA      1.001 - 1.035 1.020    Urine pH      5.0 - 8.0 6.5    Leukocyte Esterase, UA      Negative NEGATIVE    Nitrite, UA      Negative NEGATIVE    Protein, UR      Negative NEGATIVE    Glucose, UA      Negative NEGATIVE     Ketones UA      Negative NEGATIVE    Urobilinogen, UA      0.2 - 2.0 0.2    Bilirubin, UA      Negative NEGATIVE    Blood, UA      Negative NEGATIVE    RBC UA      0 - 5 /hpf 0-2    WBC, UA      0 - 5 /hpf 0-5    Squamous Epithelial Cells, Urine      0 - 25 /hpf 0-5    Urine Bacteria      Rare /hpf NONE SEEN    Hyaline Casts, UA      0 - 5 /lpf 0-5    Glucose      70 - 100 mg/dL 76    BUN      7.0 - 81.1 mg/dL 91.4    Creatinine      0.4 - 1.5 mg/dL 0.9    Sodium      782 - 145 mEq/L 135 (L)    Potassium      3.5 - 5.1 mEq/L 4.2    Chloride      100 - 111 mEq/L 104    CO2      21 - 29 mEq/L 23    Calcium      8.5 - 10.5 mg/dL 8.8  Protein Total      6.0 - 8.3 g/dL 6.8    Albumin      3.5 - 5.0 g/dL 3.8    AST      5 - 34 U/L 13    ALT      0 - 55 U/L 14    Alkaline Phosphatase      37 - 117 U/L 57    Bilirubin Total      0.2 - 1.2 mg/dL 0.6    Globulin      2.0 - 3.7 g/dL 3.0    Albumin/Globulin Ratio      0.9 - 2.2 1.3    Anion Gap      5.0 - 15.0 8.0    WBC      3.10 - 9.50 x10 3/uL 7.52    Hemoglobin      11.4 - 14.8 g/dL 16.1    Hematocrit      34.7 - 43.7 % 42.7    Platelet Count      142 - 346 x10 3/uL 247    RBC      3.90 - 5.10 x10 6/uL 4.85    MCV      78.0 - 96.0 fL 88.0    MCH      25.1 - 33.5 pg 29.3    MCHC      31.5 - 35.8 g/dL 09.6    RDW      11 - 15 % 14    MPV      8.9 - 12.5 fL 10.4    Nucleated RBC      0.0 - 0.0 /100 WBC 0.0    Nucleated RBC Absolute      0.00 - 0.00 x10 3/uL 0.00    Cholesterol      0 - 199 mg/dL 045    Triglycerides      34 - 149 mg/dL 68    HDL      40 - 4,098 mg/dL 58    LDL Calculated      0 - 99 mg/dL 94    VLDL Calculated      10 - 40 mg/dL 14    Cholesterol / HDL Ratio      See Below 2.9    Hemoglobin A1C      4.6 - 5.9 % 4.9    Average Estimated Glucose      mg/dL 11.9    TSH      1.47 - 4.94 uIU/mL 1.78    T4 Free      0.70 - 1.48 ng/dL 8.29    EGFR       >56.2    Hemolysis Index      0 - 24 5    PAP SMEAR, THIN PREP METHOD        SEE BELOW     Component       Latest Ref Rng & Units 06/27/2020   Sjogrens SSA (Ro) Antibody      0 - 19 U/mL 3   Sjogrens SSA (RO) Antibody Interpretation       Negative   Sjogrens SSB (La) Antibody      0 - 19 U/mL 2   Sjogrens SSB (La) Antibody Interpretation       Negative   Sed Rate      0 - 20 mm/Hr 9   C-Reactive Protein      0.0 - 0.8 mg/dL 0.6   Rheumatoid Factor  0.0 - 30.0 <13.0   Cyclic Citrullinated Peptide Ab, S      U <15.6       Radiology:         ASSESSMENT:   Nicole Christensen is a 45 y.o. female with possible SLE in 2020, rosacea of face.     She has Sicca symptoms and borderline +ANA. Her evaluations for Sjogren syndrome, SLE and rheumatoid arthritis were unremarkable.       PLAN:   1. Will monitor   2. Recommend refresh, artificial eye drops and drinks of water daily.  3. Recommend total calcium 1000 mg/d and Vit D 2000 iu daily  4. Follow as needed

## 2020-08-01 ENCOUNTER — Ambulatory Visit: Payer: TRICARE Prime—HMO

## 2020-08-08 ENCOUNTER — Ambulatory Visit: Payer: TRICARE Prime—HMO

## 2020-08-26 ENCOUNTER — Ambulatory Visit: Payer: TRICARE Prime—HMO

## 2020-09-24 IMAGING — US US BREAST BIL LTD
1 series · 14 of 18 positions shown · non-contrast
Comparison: The present examination has been compared to prior imaging studies.

HISTORY: Patient is 43 years old and is seen for diagnostic evaluation of a lump in the right breast, mass in both breasts and pain in the right breast. The patient has a history of bilateral biopsy in November 2018 - fibroadenoma. The patient has no personal history of cancer. The patient does not have a first degree relative with breast cancer.
TECHNIQUE: Real-time targeted ultrasound of both breasts was performed. Real-time targeted ultrasound of both breasts was performed.

[Series 2: us breast bil ltd · 14 of 18 slices shown]
[im 1/18]
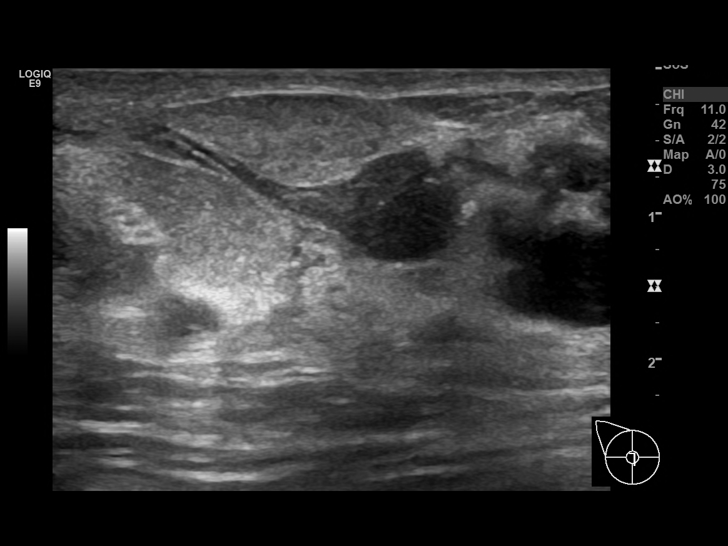
[im 2/18]
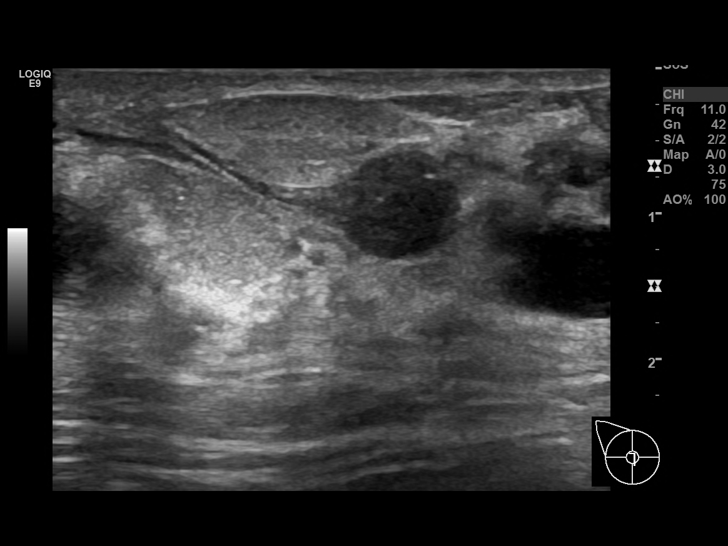
[im 4/18]
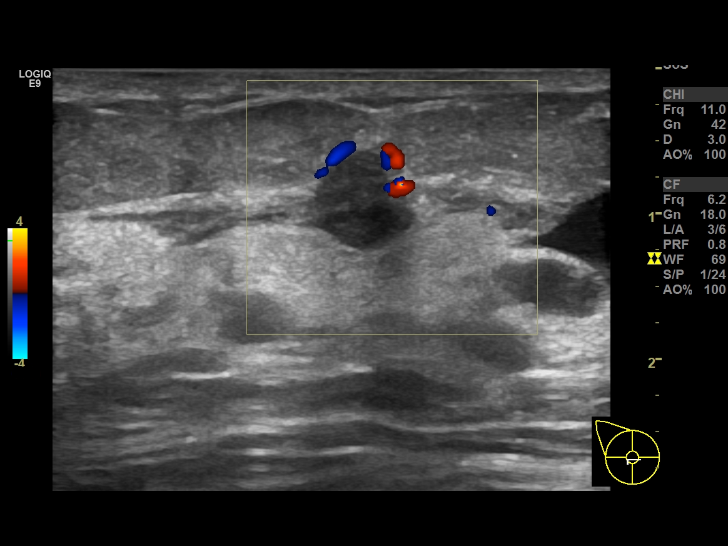
[im 5/18]
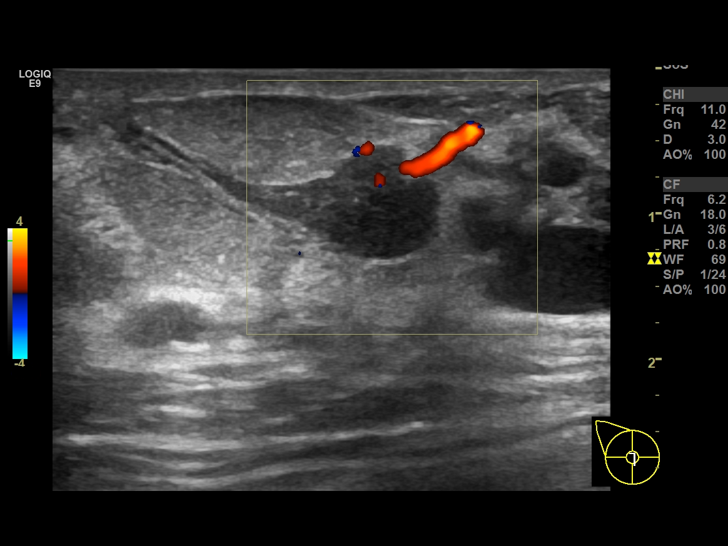
[im 6/18]
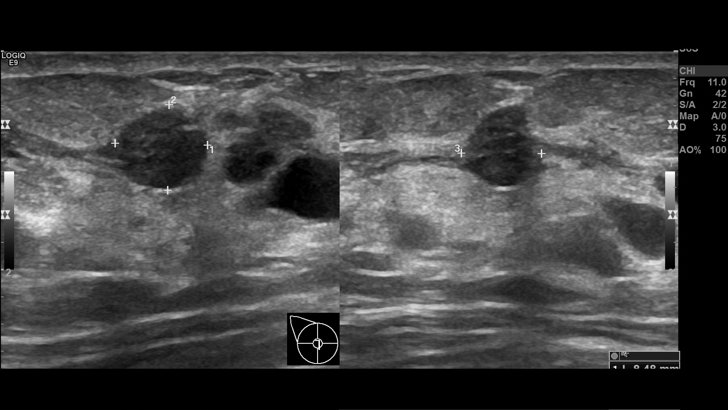
[im 8/18]
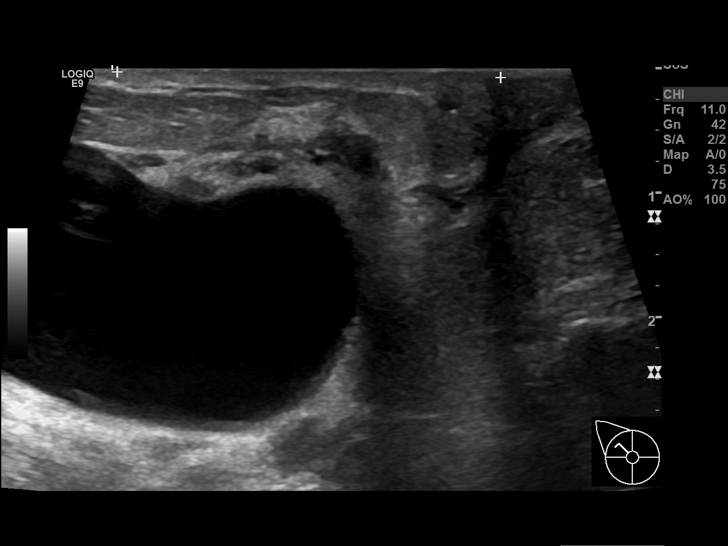
[im 9/18]
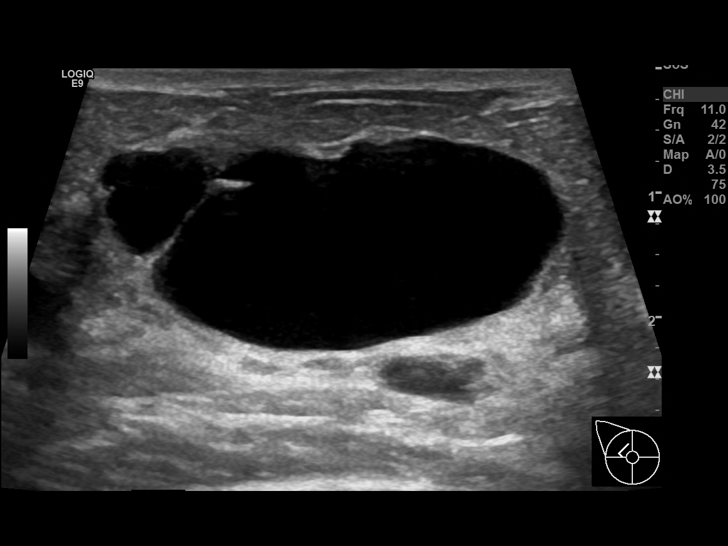
[im 10/18]
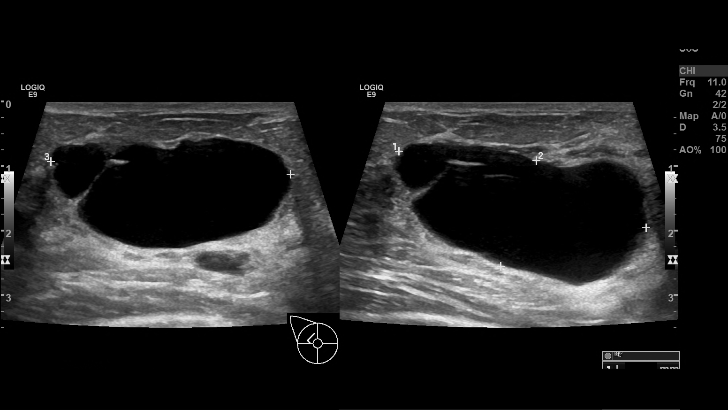
[im 11/18]
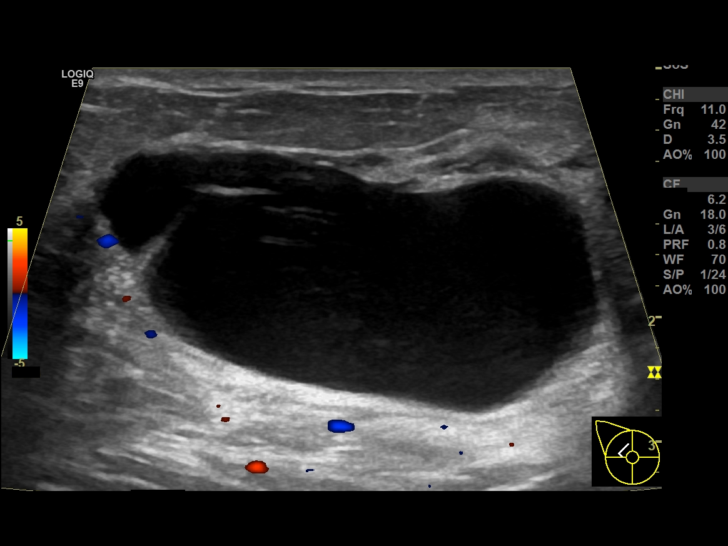
[im 13/18]
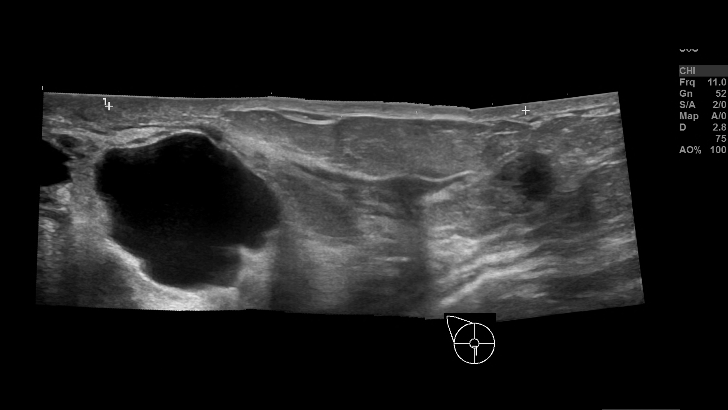
[im 14/18]
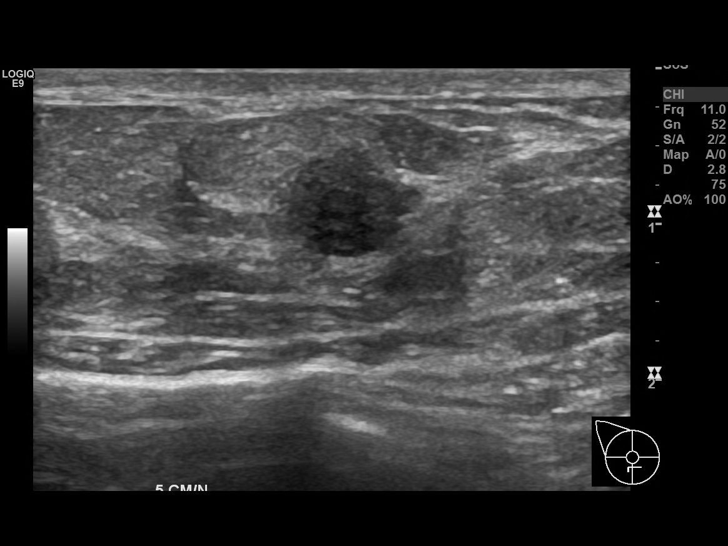
[im 15/18]
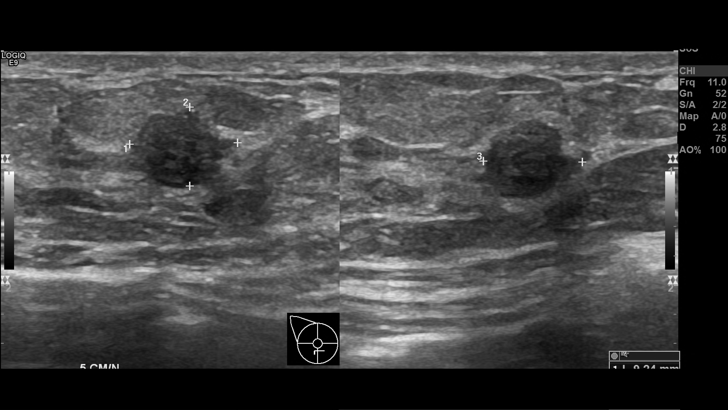
[im 17/18]
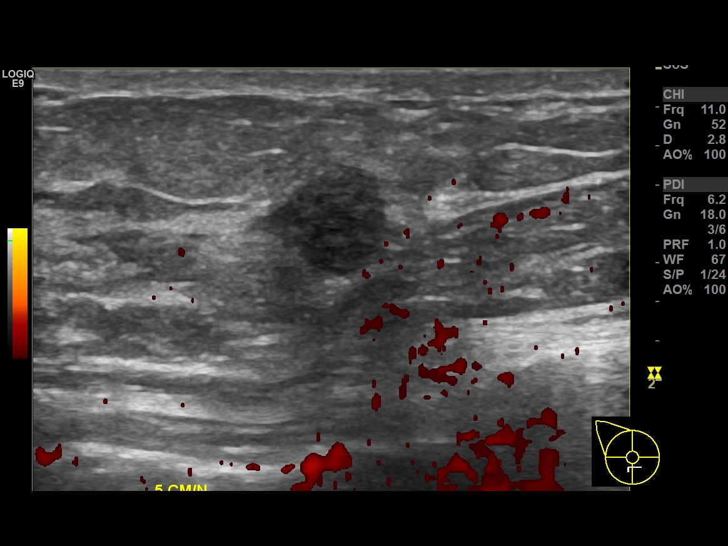
[im 18/18]
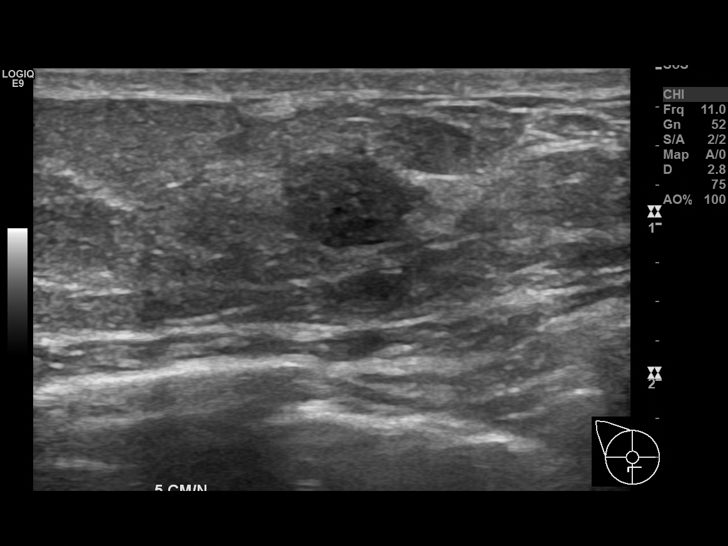

[14 of 18 positions shown; findings below may reference images not displayed]

ULTRASOUND FINDINGS:

Finding 1:  There is a stable 9 x 8 x 7 mm round mass in the right breast at 6 o'clock at anterior depth. This corresponds to previously biopsied fibroadenoma.

Finding 2:  There is a stable 9 x 7 x 8 mm round mass in the left breast at 6 o'clock at middle depth located 5 centimeters from the nipple. This corresponds to previously biopsied fibroadenoma.

Finding 3:  There is a large 40 x 17 x 37 mm cyst in the right breast at 10 o'clock located 3 centimeters from the nipple.

No suspicious abnormality is seen in either breast.
IMPRESSION: Stable previously biopsied fibroadenomas.

Incidental finding of a large cyst in the upper outer quadrant of the right breast. This would be amenable to ultrasound directed aspiration as clinically indicated.

There is no sonographic evidence of malignancy.

A return to screening in 6 months is recommended.

The patient was given a copy of her results at the time of her visit.

BI-RADS Category 2: Benign

## 2020-10-09 ENCOUNTER — Ambulatory Visit: Payer: TRICARE Prime—HMO

## 2020-11-13 ENCOUNTER — Ambulatory Visit: Admission: RE | Admit: 2020-11-13 | Payer: TRICARE Prime—HMO | Source: Ambulatory Visit

## 2020-11-13 ENCOUNTER — Ambulatory Visit: Payer: TRICARE Prime—HMO
